# Patient Record
Sex: Female | Born: 1990 | Race: White | Hispanic: No | Marital: Single
Health system: Southern US, Community
[De-identification: ages and names within clinical notes are randomized; demographics above are authoritative.]

---
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
## 2009-03-23 ENCOUNTER — Emergency Department (HOSPITAL_COMMUNITY): Admission: EM | Admit: 2009-03-23 | Discharge: 2009-03-23 | Payer: Self-pay | Admitting: Emergency Medicine

## 2010-10-26 IMAGING — CT CT HEAD W/O CM
1 of 2 series · 16 of 30 positions shown, 20 images · non-contrast
Comparison: None

CLINICAL DATA: Headache and photophobia

CT HEAD WITHOUT CONTRAST
TECHNIQUE: Contiguous axial images were obtained from the base of
the skull through the vertex without intravenous contrast.

[Series 4: head_seq 3.0 h37s st · axial · 0.43mm/px · z∈[-150,-24]mm · 16 of 48 slices shown, 20 images]
[im 3/48  brain]
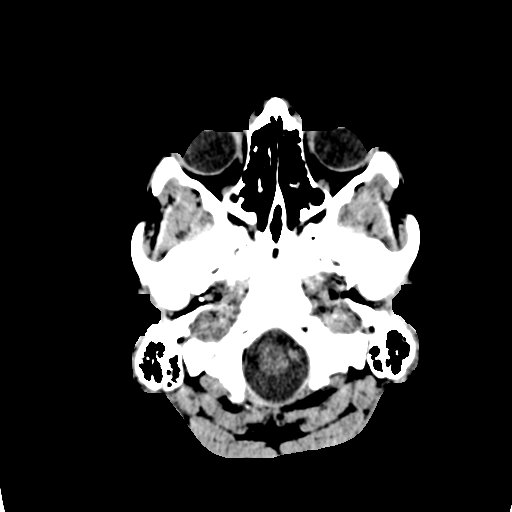
[im 3/48  bone]
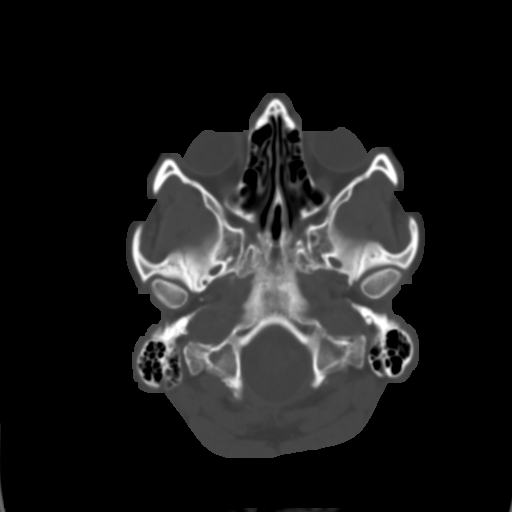
[im 6/48  brain]
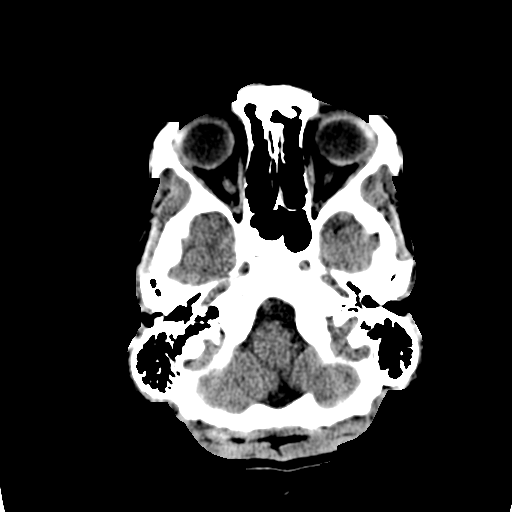
[im 9/48  brain]
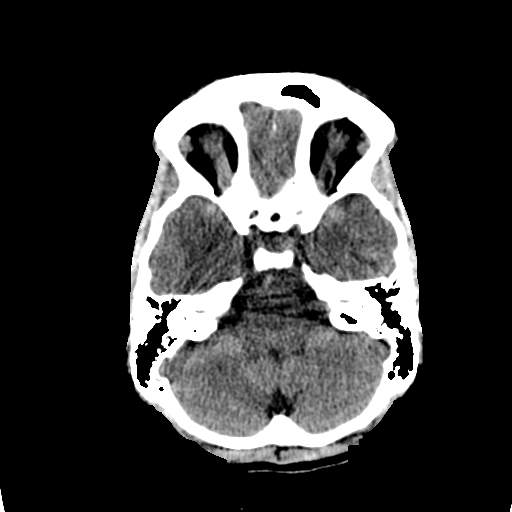
[im 12/48  brain]
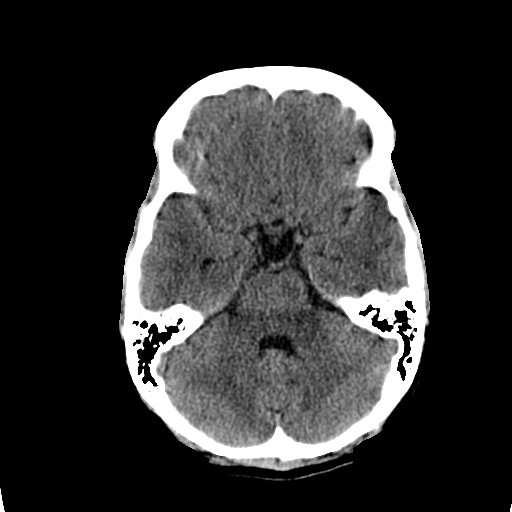
[im 14/48  brain]
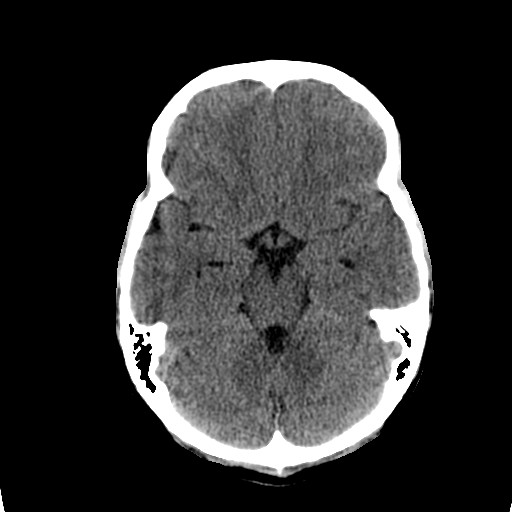
[im 14/48  bone]
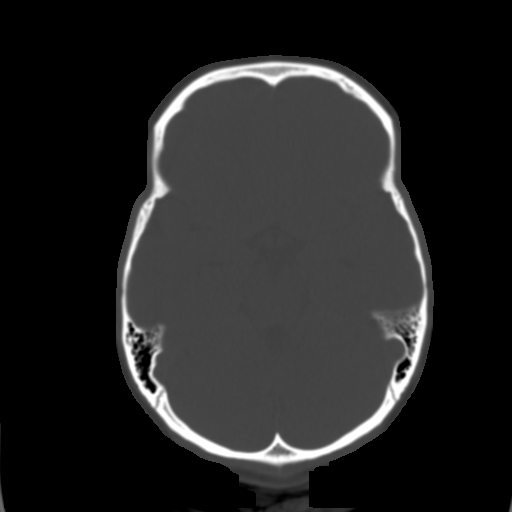
[im 17/48  brain]
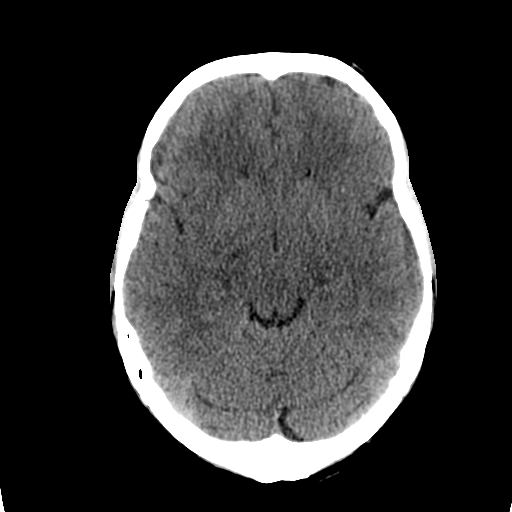
[im 20/48  brain]
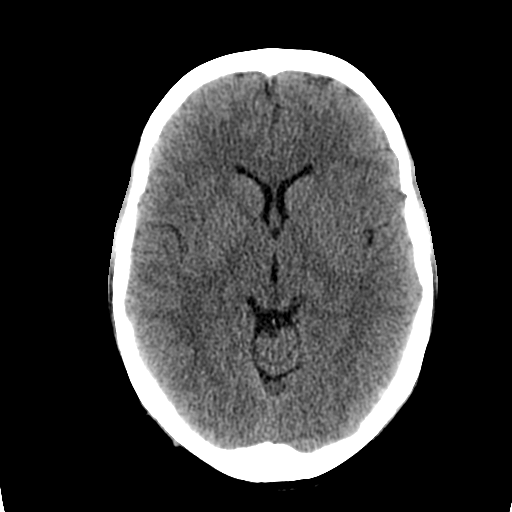
[im 23/48  brain]
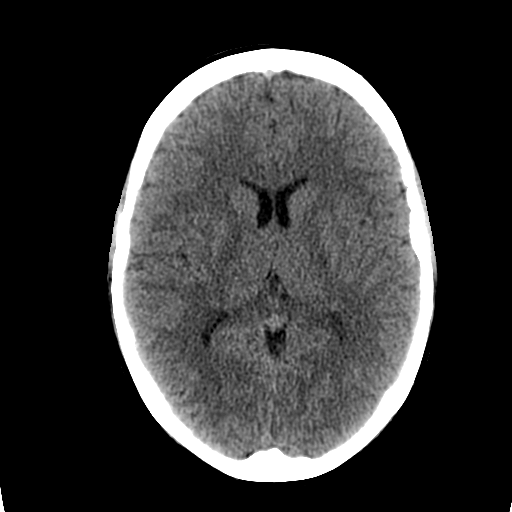
[im 25/48  brain]
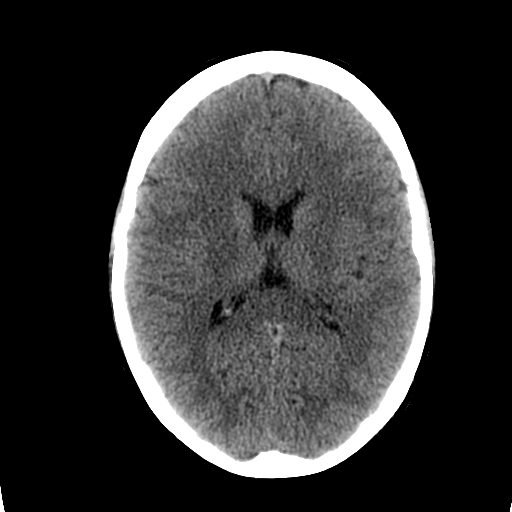
[im 25/48  bone]
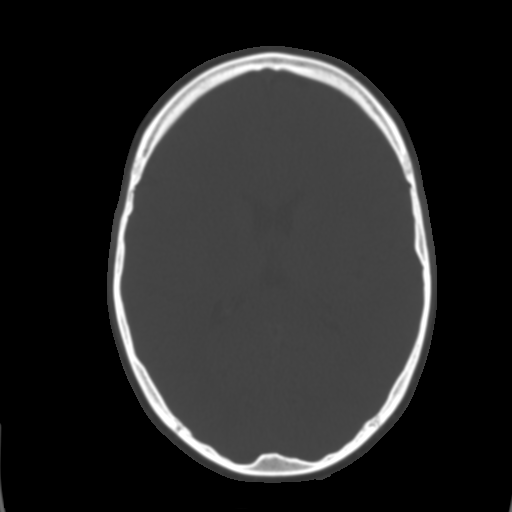
[im 28/48  brain]
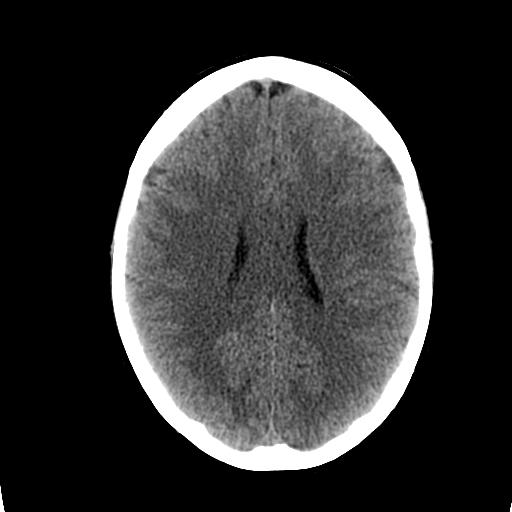
[im 31/48  brain]
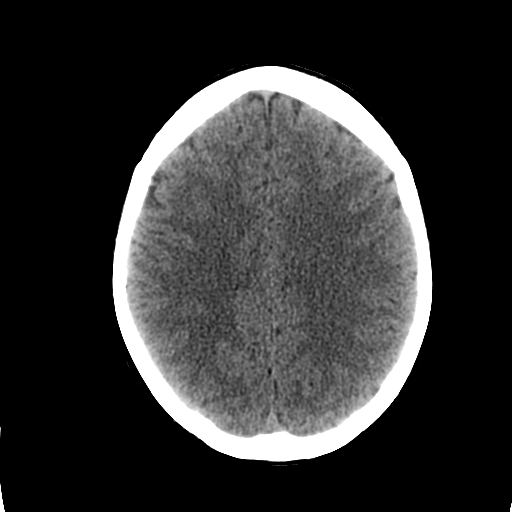
[im 34/48  brain]
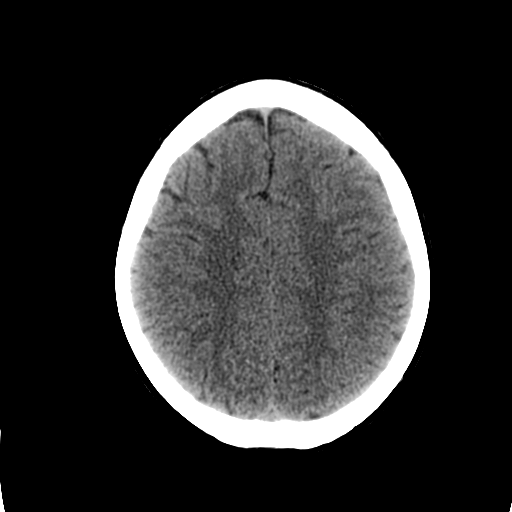
[im 36/48  brain]
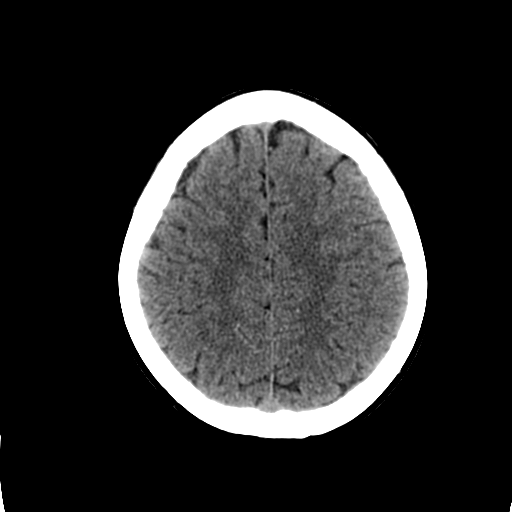
[im 36/48  bone]
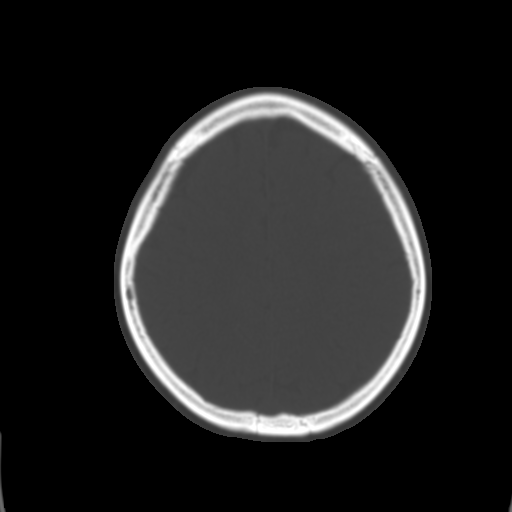
[im 39/48  brain]
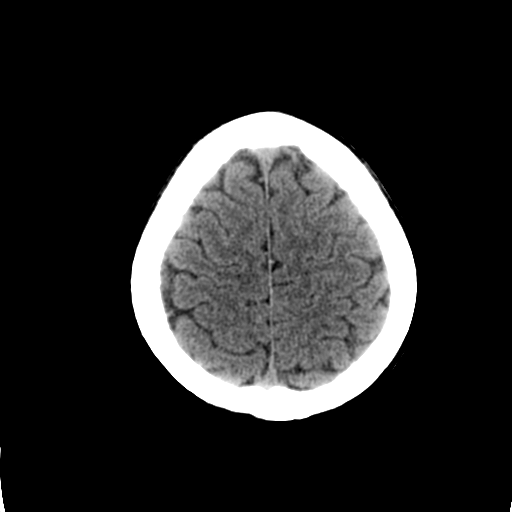
[im 42/48  brain]
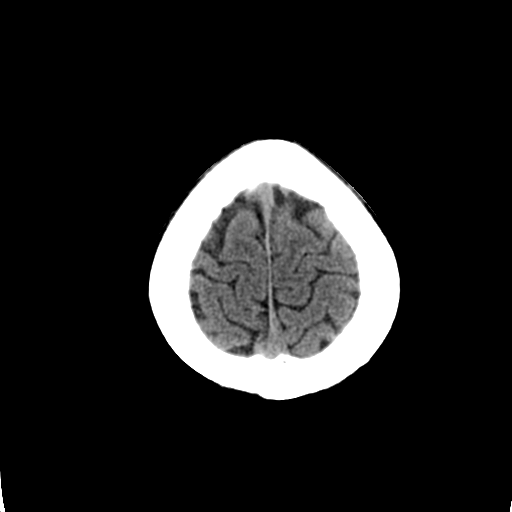
[im 45/48  brain]
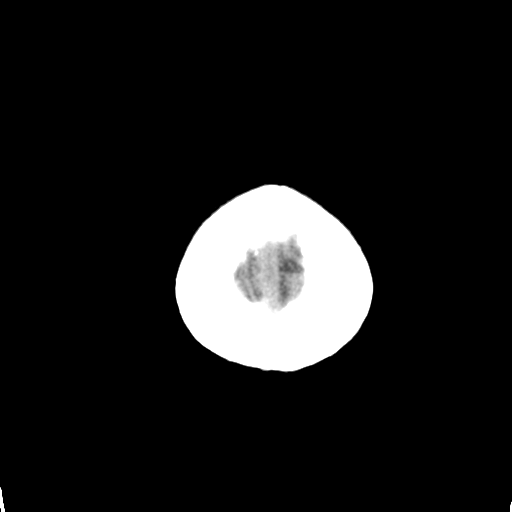

[16 of 30 positions shown; findings below may reference images not displayed]

FINDINGS: Ventricles are normal in size.  There is no intracranial
hemorrhage.  1 cm low density in the left pons is likely an  area
of artifact.  If the patient has right-sided weakness, consider
infarct or mass lesion.  The cerebral white matter is normal.  No
other abnormalities are identified.  The skull is normal.
IMPRESSION: Low density in the left pons.  I would favor this is artifact
however if there are symptoms referable to this area, MRI is
suggested for further evaluation.

## 2011-01-30 LAB — GLUCOSE, CAPILLARY

## 2011-03-10 NOTE — Op Note (Signed)
Tammy Matthews, Tammy Matthews NO.:  [id number]   MEDICAL RECORD NO.:  [id number]          PATIENT TYPE:  AMB   LOCATION:  DSC                          FACILITY:  [facility]   PHYSICIAN:  Loreta Ave, M.D. DATE OF BIRTH:  [birth year]   DATE OF PROCEDURE:  03/15/2006  DATE OF DISCHARGE:                                 OPERATIVE REPORT   PREOPERATIVE DIAGNOSIS:  Left knee anterior cruciate ligament tear with  anterolateral rotary instability.   POSTOPERATIVE DIAGNOSIS:  Left knee anterior cruciate ligament tear with  anterolateral rotary instability.   OPERATIVE PROCEDURE:  Left knee examination under anesthesia, arthroscopy,  with arthroscopic, endoscopic, anterior cruciate ligament reconstruction,  patellar tendon autograft, bone-tendon-bone.  Bioabsorbable screw fixation.  Notchplasty.   SURGEON:  Loreta Ave, M.D.   ASSISTANT:  Julien Girt, P.A.   ANESTHESIA:  General.   BLOOD LOSS:  Minimal.   TOURNIQUET TIME:  One hour and 15 minutes.   SPECIMENS:  None.   CULTURES:  None.   COMPLICATIONS:  None.   DRESSING:  Soft compressed with knee immobilizer.   PROCEDURE:  Patient brought to the operating room.  After adequate  anesthesia had been obtained, the left knee was examined.  Full motion.  Positive Lachman, positive drawer, positive pivot shift.  The patellofemoral  joint and remaining ligament is stable.  Tourniquet applied, prepped and  draped in the usual sterile fashion.  Exsanguinated with elevation and  esmarch.  The tourniquet inflated to 300 mmHg.  Three portals created, one  superolateral, medial, and lateral parapatellar.  Inflow catheter  introduced.  A standard arthroscope introduced.  The knee inspected.  Articular cartilage intact throughout.  Good patellofemoral tracking.  Medial and lateral meniscus intact.  Complete __________ ACL tear, debrided.  Very narrow notch.  Opened up with notchplasty with shaver and high speed  bur.  PCL intact.  Instruments and fluid removed.  Anterior incision,  patella to tibial tubercle.  Middle third patellar tendon harvested 9 mm  wide with bone paste at either end .  Appropriate for her size.  The graft  was then prepared for passage through 9 mm tunnels with FiberWire placed at  either end.  Defect in the tendon closed with Vicryl.  Arthroscope  introduced.  Tibial tunnel created with a small incision next to the  tubercle.  Guidewire placed and then overdrilled with a 9 mm reamer.  Repaired with a shaver.  Femoral guide inserted.  On the back cortex of the  femur, guidewire placed and overdrilled with a 9 mm reamer for appropriate  depth for the graft and pegs.  Both tunnels assessed, found to be in  excellent position.  Utilizing nitinol wires at either end, I used a two pin  passer to pull a graft in across the knee.  With appropriate tensioning  technique and nitinol wires on either side, the graft was firmly fixed with  bioabsorbable screws, 8 x 25 mm, on both sides with the knee tensioned at 70  degrees of flexion.  At completion, excellent capture and fixation of the  graft above and below.  Excellent clearance when viewed arthroscopically  through full motion.  All nitinol wires and sutures have been removed.  Negative Lachman, negative drawer.  Excellent stability.  Wounds were all  irrigated and closed with subcutaneous, subcuticular Vicryl.  Portals closed  with nylon.  Sterile compressive dressing applied.  Tourniquet deflated and  removed.  The knee immobilizer applied.  Anesthesia reversed.  Brought to  the recovery room.  Tolerated the surgery well.  No complications.      Loreta Ave, M.D.  Electronically Signed     DFM/MEDQ  D:  03/15/2006  T:  03/15/2006  Job:  [id number]

## 2022-07-07 ENCOUNTER — Ambulatory Visit (HOSPITAL_COMMUNITY)
Admission: EM | Admit: 2022-07-07 | Discharge: 2022-07-10 | Disposition: A | Discharge status: Home / Self Care | Payer: Commercial Managed Care - PPO | Attending: Family | Admitting: Family

## 2022-07-07 DIAGNOSIS — F22 Delusional disorders: Secondary | ICD-10-CM

## 2022-07-07 DIAGNOSIS — Z20822 Contact with and (suspected) exposure to covid-19: Secondary | ICD-10-CM | POA: Diagnosis not present

## 2022-07-07 DIAGNOSIS — Z79899 Other long term (current) drug therapy: Secondary | ICD-10-CM | POA: Diagnosis not present

## 2022-07-07 DIAGNOSIS — F419 Anxiety disorder, unspecified: Secondary | ICD-10-CM | POA: Insufficient documentation

## 2022-07-07 DIAGNOSIS — F329 Major depressive disorder, single episode, unspecified: Secondary | ICD-10-CM | POA: Insufficient documentation

## 2022-07-07 DIAGNOSIS — F600 Paranoid personality disorder: Secondary | ICD-10-CM | POA: Insufficient documentation

## 2022-07-07 LAB — RESP PANEL BY RT-PCR (FLU A&B, COVID) ARPGX2
Influenza A by PCR: NEGATIVE
Influenza B by PCR: NEGATIVE
SARS Coronavirus 2 by RT PCR: NEGATIVE

## 2022-07-07 LAB — POC SARS CORONAVIRUS 2 AG: SARSCOV2ONAVIRUS 2 AG: NEGATIVE

## 2022-07-07 MED ORDER — MAGNESIUM HYDROXIDE 400 MG/5ML PO SUSP: 30.0000 mL | Freq: Every day | ORAL | Status: DC | PRN

## 2022-07-07 MED ORDER — ACETAMINOPHEN 325 MG PO TABS: 650.0000 mg | ORAL_TABLET | Freq: Four times a day (QID) | ORAL | Status: DC | PRN

## 2022-07-07 MED ORDER — OLANZAPINE 5 MG PO TBDP: 2.5000 mg | ORAL_TABLET | Freq: Two times a day (BID) | ORAL | Status: DC | PRN

## 2022-07-07 MED ORDER — LORAZEPAM 1 MG PO TABS: 1.0000 mg | ORAL_TABLET | ORAL | Status: DC | PRN

## 2022-07-07 MED ORDER — OLANZAPINE 5 MG PO TBDP: 5.0000 mg | ORAL_TABLET | Freq: Three times a day (TID) | ORAL | Status: DC | PRN

## 2022-07-07 MED ORDER — HYDROXYZINE HCL 25 MG PO TABS
25.0000 mg | ORAL_TABLET | Freq: Three times a day (TID) | ORAL | Status: DC | PRN
  Administered 2022-07-08 – 2022-07-09 (×4): 25 mg via ORAL
  Filled 2022-07-07 (×4): qty 1

## 2022-07-07 MED ORDER — TRAZODONE HCL 50 MG PO TABS
50.0000 mg | ORAL_TABLET | Freq: Every evening | ORAL | Status: DC | PRN
  Administered 2022-07-08 – 2022-07-09 (×2): 50 mg via ORAL
  Filled 2022-07-07 (×2): qty 1

## 2022-07-07 MED ORDER — ZIPRASIDONE MESYLATE 20 MG IM SOLR: 20.0000 mg | INTRAMUSCULAR | Status: DC | PRN

## 2022-07-07 MED ORDER — OLANZAPINE 5 MG PO TBDP
5.0000 mg | ORAL_TABLET | Freq: Every day | ORAL | Status: DC
  Administered 2022-07-07 – 2022-07-09 (×3): 5 mg via ORAL
  Filled 2022-07-07 (×3): qty 1

## 2022-07-07 MED ORDER — ALUM & MAG HYDROXIDE-SIMETH 200-200-20 MG/5ML PO SUSP: 30.0000 mL | ORAL | Status: DC | PRN

## 2022-07-07 NOTE — ED Notes (Signed)
Pt continues to rest quietly.  Breathing even and unlabored.  Staff will continue to monitor for safety.
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
## 2022-07-07 NOTE — ED Notes (Signed)
Pt sleeping at this time.  Breathing even and unlabored.

## 2022-07-07 NOTE — ED Triage Notes (Signed)
Pt presents to [facility] accompanied by her parents seeking a mental health evaluation. Pt states she randomly started having issues walking in the past 48 hours which resulted in her falling a few time and she also had difficulty speaking, (slow speech and stuttering).Pts states she was seen in the ED lastnight for her symptoms and was referred to a neurologist.Pts mother states she brought her to this facility for a referral to a psychologist. Pt endorses using cocaine on occasion but nothing in the past 24 hrs.Pt denies SI/HI and AVH

## 2022-07-07 NOTE — ED Provider Notes (Signed)
Patient's mother, Monserath Neff, cell phone number [phone].
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
## 2022-07-08 DIAGNOSIS — F22 Delusional disorders: Secondary | ICD-10-CM | POA: Diagnosis not present

## 2022-07-08 LAB — POCT URINE DRUG SCREEN - MANUAL ENTRY (I-SCREEN)
POC Amphetamine UR: NOT DETECTED
POC Buprenorphine (BUP): NOT DETECTED
POC Cocaine UR: POSITIVE — AB
POC Marijuana UR: POSITIVE — AB
POC Methadone UR: NOT DETECTED
POC Methamphetamine UR: NOT DETECTED
POC Morphine: NOT DETECTED
POC Oxazepam (BZO): NOT DETECTED
POC Oxycodone UR: NOT DETECTED
POC Secobarbital (BAR): NOT DETECTED

## 2022-07-08 LAB — POCT PREGNANCY, URINE: Preg Test, Ur: NEGATIVE

## 2022-07-08 NOTE — Progress Notes (Signed)
Patient is awake on unit without distress.  Patient has been encouraged to drink water as she has been dehydrated and RN has not been able to obtain labs.  Patient is more organized, less manic with softer slower speech and thought.  Her insight into substance use remains poor despite attempts to educate regarding use.  Patient states she has been "sober" from etoh for over 200 days however admits to using cocaine 2 weeks ago.  She does not feel that her cocaine use is a problem.  RN will attempt to obtain labs again shortly.  Will monitor and provide safe environment for patient.  At this time she denies avh shi or plan.  She is pleasant and cooperative with care despite not being happy with admission.

## 2022-07-08 NOTE — Progress Notes (Signed)
Per Beatriz Stallion, NP, patient meets criteria for inpatient treatment. There are no available beds at [facility] today. CSW faxed referrals to the following facilities for review:  [address] [place] [address] [phone] --  [place] [facility] [address] [place] [address] [phone] --  [address] [place] [address] [phone] --  [place] [address], [place] [address] [phone] --  [place] [facility] [address] [place], [place] [address] [phone] --  [place] [address] [place] [address] [phone] --  [place] [address] [place] [address] [phone] --  [place] [address] [place] [address] [phone] --  [place] [address] [place] [address] [phone] --  [place] [address] [place] [address] [phone] --  [place] [address] [place] [address] [phone] --  [place] [address] [place] [address] [phone] --   [place], MSW, [place], LCAS Phone: [phone] Disposition/TOC

## 2022-07-08 NOTE — ED Notes (Signed)
Patient has not ate breakfast/lunch/dinner. Patient stated she is waiting to eat chic fil a as soon as she gets out. Labs not taken yesterday as patient has been irritable but encouraging patient to drink fluids before attempting lab work. Patient observed drinking at least 2 cups of water and is now consenting to lab work and EKG.

## 2022-07-08 NOTE — Progress Notes (Signed)
Patient is asleep in bed without issue.  She asked for and was given PO PRN of trazodone.  Will monitor.

## 2022-07-08 NOTE — ED Provider Notes (Cosign Needed Addendum)
Behavioral Health Progress Note  Date and Time: 07/08/2022 6:51 PM Name: Tammy Matthews MRN:  [id number]  Subjective:  Tammy Matthews 31 y.o., female patient who presented to the [facility] on 11/16/2021.  She was admitted to the continuous assessment unit while awaiting inpatient psychiatric bed availability.  While at the facility patient was placed under involuntary commitment.  IVC petitioner is Doran Heater, NP behavioral health nurse practitioner.  IVC findings, "patient presents with paranoid ideations.  Conversation bizarre and tangential.  She reports conflicts last night because she believed that 3 people had broken in her apartment and shipped her into a closet.  Police arrived to find no evidence that break and occurred.  Patient also endorses bilateral lower extremity weakness, reports falling down stairs at home twice this week.  She is not sleeping.  Patient represents is a danger to herself and appears to lack insight".  Tammy Matthews, 31 y.o., female patient seen face to face by this provider, consulted with Dr. Lucianne Muss; and chart reviewed on 07/08/22.  Per chart review she has been diagnosed with anxiety, MDD, and ADD.  She has services in place without virtual provider Dr. Marella Chimes.  She is prescribed methylphenidate, Lexapro, and doxepin.  She reports compliance with medications.  She denies any history of inpatient psychiatric admissions.  During evaluation Tammy Matthews is observed talking on the phone to her mother.  Her voice is loud and pressured.  She is irritated, slams the phone down and returns to her bed.  She is tearful and states, "my mother is the reason that I am in this place, this jail".  She is alert/oriented x4, cooperative and makes fair eye contact.  She is tangential.  When asked if she is experiencing any depressive symptoms she states, "this whole place makes me depressed, my first amendment rights have been taken away".  Reports that she did get some sleep last  night.  She endorses auditory hallucinations of hearing music specifically to songs.  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  She denies visual hallucinations.  She denies being paranoid but then discusses that her house was broken into the police came to investigate.  Per chart review patient's house was not broken into and the police did not come to her home.  She denies SI/HI.  She denies visual hallucinations.  Patient is guarded throughout the assessment and answers questions minimally.  States, "this is what I get for being honest".  States after she told her mother that she uses cocaine occasionally they brought her here and had her admitted to this place.  Will continue the recommendation for inpatient psychiatric treatment..  Lab work has not been collected this time, patient refused initially.  Nursing staff will retry today.  Diagnosis:  Final diagnoses:  Paranoid ideation (HCC)    Total Time spent with patient: 30 minutes Per chart review she has been diagnosed with anxiety, MDD, and ADD. Past Psychiatric History:  Past Medical History: No past medical history on file. The histories are not reviewed yet. Please review them in the "History" navigator section and refresh this SmartLink. Family History: No family history on file. Family Psychiatric  History: unknown Social History:  Social History   Substance and Sexual Activity  Alcohol Use Not on file     Social History   Substance and Sexual Activity  Drug Use Not on file    Social History   Socioeconomic History   Marital status:  Single    Spouse name: Not on file   Number of children: Not on file   Years of education: Not on file   Highest education level: Not on file  Occupational History   Not on file  Tobacco Use   Smoking status: Not on file   Smokeless tobacco: Not on file  Substance and Sexual Activity   Alcohol use: Not on file   Drug use: Not on file   Sexual activity:  Not on file  Other Topics Concern   Not on file  Social History Narrative   Not on file   Social Determinants of Health   Financial Resource Strain: Not on file  Food Insecurity: Not on file  Transportation Needs: Not on file  Physical Activity: Not on file  Stress: Not on file  Social Connections: Not on file   SDOH:  SDOH Screenings   Depression (PHQ2-9): Low Risk  (07/07/2022)   Additional Social History:    Pain Medications: see MAR Prescriptions: see MAR Over the Counter: see MAR History of alcohol / drug use?: Yes 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 Negative Consequences of Use: Personal relationships Withdrawal Symptoms: None Name of Substance 1: cocaine 1 - Age of First Use: 31 1 - Amount (size/oz): varies 1 - Frequency: 1 x month 1 - Duration: ongoing 1 - Last Use / Amount: about 5 days ago 1 - Method of Aquiring: unknown 1- Route of Use: unknown Name of Substance 2: cannabis 2 - Age of First Use: unknown 2 - Amount (size/oz): varies 2 - Frequency: daily 2 - Duration: ongoing 2 - Last Use / Amount: 2 days ago 2 - Method of Aquiring: unknown 2 - Route of Substance Use: varies                Sleep: Fair  Appetite:  Fair  Current Medications:  Current Facility-Administered Medications  Medication Dose Route Frequency Provider Last Rate Last Admin   acetaminophen (TYLENOL) tablet 650 mg  650 mg Oral Q6H PRN Lenard Lance, FNP       alum & mag hydroxide-simeth (MAALOX/MYLANTA) 200-200-20 MG/5ML suspension 30 mL  30 mL Oral Q4H PRN Lenard Lance, FNP       hydrOXYzine (ATARAX) tablet 25 mg  25 mg Oral TID PRN Lenard Lance, FNP   25 mg at 07/08/22 1208   OLANZapine zydis (ZYPREXA) disintegrating tablet 5 mg  5 mg Oral Q8H PRN Lenard Lance, FNP       And   LORazepam (ATIVAN) tablet 1 mg  1 mg Oral PRN Lenard Lance, FNP       And   ziprasidone (GEODON) injection 20 mg  20 mg Intramuscular PRN Lenard Lance, FNP        magnesium hydroxide (MILK OF MAGNESIA) suspension 30 mL  30 mL Oral Daily PRN Lenard Lance, FNP       OLANZapine zydis (ZYPREXA) disintegrating tablet 2.5 mg  2.5 mg Oral BID PRN Lenard Lance, FNP       OLANZapine zydis (ZYPREXA) disintegrating tablet 5 mg  5 mg Oral QHS Lenard Lance, FNP   5 mg at 07/07/22 2143   traZODone (DESYREL) tablet 50 mg  50 mg Oral QHS PRN Lenard Lance, FNP       Current Outpatient Medications  Medication Sig Dispense Refill   doxepin (SINEQUAN) 50 MG capsule Take 50 mg by mouth at bedtime.  escitalopram (LEXAPRO) 10 MG tablet Take 10 mg by mouth daily.     methylphenidate (RITALIN) 10 MG tablet Take 10 mg by mouth daily.     Multiple Vitamin (MULTIVITAMIN WITH MINERALS) TABS tablet Take 1 tablet by mouth daily.      Labs  Lab Results:  Admission on 07/07/2022  Component Date Value Ref Range Status   SARS Coronavirus 2 by RT PCR 07/07/2022 NEGATIVE  NEGATIVE Final   Comment: (NOTE) SARS-CoV-2 target nucleic acids are NOT DETECTED.  The SARS-CoV-2 RNA is generally detectable in upper respiratory specimens during the acute phase of infection. The lowest concentration of SARS-CoV-2 viral copies this assay can detect is 138 copies/mL. A negative result does not preclude SARS-Cov-2 infection and should not be used as the sole basis for treatment or other patient management decisions. A negative result may occur with  improper specimen collection/handling, submission of specimen other than nasopharyngeal swab, presence of viral mutation(s) within the areas targeted by this assay, and inadequate number of viral copies(<138 copies/mL). A negative result must be combined with clinical observations, patient history, and epidemiological information. The expected result is Negative.  Fact Sheet for Patients:  BloggerCourse.com  Fact Sheet for Healthcare Providers:  SeriousBroker.it  This test is no                           t yet approved or cleared by the [place] FDA and  has been authorized for detection and/or diagnosis of SARS-CoV-2 by FDA under an Emergency Use Authorization (EUA). This EUA will remain  in effect (meaning this test can be used) for the duration of the COVID-19 declaration under Section 564(b)(1) of the Act, 21 U.S.C.section 360bbb-3(b)(1), unless the authorization is terminated  or revoked sooner.       Influenza A by PCR 07/07/2022 NEGATIVE  NEGATIVE Final   Influenza B by PCR 07/07/2022 NEGATIVE  NEGATIVE Final   Comment: (NOTE) The Xpert Xpress SARS-CoV-2/FLU/RSV plus assay is intended as an aid in the diagnosis of influenza from Nasopharyngeal swab specimens and should not be used as a sole basis for treatment. Nasal washings and aspirates are unacceptable for Xpert Xpress SARS-CoV-2/FLU/RSV testing.  Fact Sheet for Patients: BloggerCourse.com  Fact Sheet for Healthcare Providers: SeriousBroker.it  This test is not yet approved or cleared by the [place] FDA and has been authorized for detection and/or diagnosis of SARS-CoV-2 by FDA under an Emergency Use Authorization (EUA). This EUA will remain in effect (meaning this test can be used) for the duration of the COVID-19 declaration under Section 564(b)(1) of the Act, 21 U.S.C. section 360bbb-3(b)(1), unless the authorization is terminated or revoked.  Performed at [facility], [address] [place], [place] [address]    POC Amphetamine UR 07/08/2022 None Detected  NONE DETECTED (Cut Off Level 1000 ng/mL) Final   POC Secobarbital (BAR) 07/08/2022 None Detected  NONE DETECTED (Cut Off Level 300 ng/mL) Final   POC Buprenorphine (BUP) 07/08/2022 None Detected  NONE DETECTED (Cut Off Level 10 ng/mL) Final   POC Oxazepam (BZO) 07/08/2022 None Detected  NONE DETECTED (Cut Off Level 300 ng/mL) Final   POC Cocaine UR 07/08/2022 Positive (A)   NONE DETECTED (Cut Off Level 300 ng/mL) Final   POC Methamphetamine UR 07/08/2022 None Detected  NONE DETECTED (Cut Off Level 1000 ng/mL) Final   POC Morphine 07/08/2022 None Detected  NONE DETECTED (Cut Off Level 300 ng/mL) Final   POC Methadone  UR 07/08/2022 None Detected  NONE DETECTED (Cut Off Level 300 ng/mL) Final   POC Oxycodone UR 07/08/2022 None Detected  NONE DETECTED (Cut Off Level 100 ng/mL) Final   POC Marijuana UR 07/08/2022 Positive (A)  NONE DETECTED (Cut Off Level 50 ng/mL) Final   SARSCOV2ONAVIRUS 2 AG 07/07/2022 NEGATIVE  NEGATIVE Final   Comment: (NOTE) SARS-CoV-2 antigen NOT DETECTED.   Negative results are presumptive.  Negative results do not preclude SARS-CoV-2 infection and should not be used as the sole basis for treatment or other patient management decisions, including infection  control decisions, particularly in the presence of clinical signs and  symptoms consistent with COVID-19, or in those who have been in contact with the virus.  Negative results must be combined with clinical observations, patient history, and epidemiological information. The expected result is Negative.  Fact Sheet for Patients: https://www.jennings-kim.com/https://www.fda.gov/media/141569/download  Fact Sheet for Healthcare Providers: https://alexander-rogers.biz/https://www.fda.gov/media/141568/download  This test is not yet approved or cleared by the [place] FDA and  has been authorized for detection and/or diagnosis of SARS-CoV-2 by FDA under an Emergency Use Authorization (EUA).  This EUA will remain in effect (meaning this test can be used) for the duration of  the COV                          ID-19 declaration under Section 564(b)(1) of the Act, 21 U.S.C. section 360bbb-3(b)(1), unless the authorization is terminated or revoked sooner.     Preg Test, Ur 07/08/2022 NEGATIVE  NEGATIVE Final   Comment:        THE SENSITIVITY OF THIS METHODOLOGY IS >24 mIU/mL     Blood Alcohol level:  No results found for:  "ETH"  Metabolic Disorder Labs: No results found for: "HGBA1C", "MPG" No results found for: "PROLACTIN" No results found for: "CHOL", "TRIG", "HDL", "CHOLHDL", "VLDL", "LDLCALC"  Therapeutic Lab Levels: No results found for: "LITHIUM" No results found for: "VALPROATE" No results found for: "CBMZ"  Physical Findings   PHQ2-9    [facility]  PHQ-2 Total Score 1      [facility]  C-SSRS RISK CATEGORY No Risk        Musculoskeletal  Strength & Muscle Tone: within normal limits Gait & Station: normal Patient leans: N/A  Psychiatric Specialty Exam  Presentation  General Appearance: Casual  Eye Contact:Fair  Speech:Pressured  Speech Volume:Normal  Handedness:Right   Mood and Affect  Mood:Anxious; Labile  Affect:Congruent; Labile; Depressed; Tearful   Thought Process  Thought Processes:Coherent  Descriptions of Associations:Intact  Orientation:Full (Time, Place and Person)  Thought Content:Tangential  Diagnosis of Schizophrenia or Schizoaffective disorder in past: No  Duration of Psychotic Symptoms: Less than six months   Hallucinations:Hallucinations: Auditory Description of Auditory Hallucinations: hears music  Ideas of Reference:Paranoia  Suicidal Thoughts:Suicidal Thoughts: No  Homicidal Thoughts:Homicidal Thoughts: No   Sensorium  Memory:Immediate Fair; Remote Fair; Recent Fair  Judgment:Impaired  Insight:Lacking   Executive Functions  Concentration:Fair  Attention Span:Fair  Recall:Fair  Fund of Knowledge:Fair  Language:Good   Psychomotor Activity  Psychomotor Activity:Psychomotor Activity: Normal   Assets  Assets:Communication Skills; Desire for Improvement; Resilience; Social Support; Physical Health; Leisure Time   Sleep  Sleep:Sleep: Fair   Nutritional Assessment (For OBS and FBC  admissions only) Has the patient had a weight loss or gain of 10 pounds or more in the last 3 months?:  No Has the patient had a decrease in food intake/or appetite?: No Does the patient have dental problems?: No Does the patient have eating habits or behaviors that may be indicators of an eating disorder including binging or inducing vomiting?: No Has the patient recently lost weight without trying?: 0 Has the patient been eating poorly because of a decreased appetite?: 0 Malnutrition Screening Tool Score: 0    Physical Exam  Physical Exam Vitals and nursing note reviewed.  Constitutional:      General: She is not in acute distress.    Appearance: Normal appearance. She is not ill-appearing.  HENT:     Head: Normocephalic.  Eyes:     Conjunctiva/sclera: Conjunctivae normal.  Cardiovascular:     Rate and Rhythm: Normal rate.  Pulmonary:     Effort: Pulmonary effort is normal.  Musculoskeletal:        General: Normal range of motion.     Cervical back: Normal range of motion.  Skin:    Coloration: Skin is not jaundiced or pale.  Neurological:     Mental Status: She is alert and oriented to person, place, and time.  Psychiatric:        Attention and Perception: She perceives auditory hallucinations.        Mood and Affect: Mood is anxious and depressed. Affect is tearful.        Speech: Speech is rapid and pressured and tangential.        Behavior: Behavior is cooperative.        Thought Content: Thought content is paranoid.        Cognition and Memory: Cognition normal.        Judgment: Judgment is impulsive.    Review of Systems  Constitutional: Negative.   HENT: Negative.    Eyes: Negative.   Respiratory: Negative.    Cardiovascular: Negative.   Musculoskeletal: Negative.   Skin: Negative.   Neurological: Negative.   Psychiatric/Behavioral:  Positive for depression, hallucinations and suicidal ideas. The patient is nervous/anxious.    Blood pressure 117/88, pulse  (!) 110, temperature 97.7 F (36.5 C), temperature source Oral, resp. rate 18, SpO2 100 %. There is no height or weight on file to calculate BMI.  Treatment Plan Summary:  Disposition: Patient continues to meet inpatient psychiatric admission.  IVC will remain in place.  Will continue to have daily contact with patient to assess and evaluate symptoms and progress in treatment and Medication management  Revonda Humphrey, NP 07/08/2022 6:51 PM

## 2022-07-08 NOTE — ED Notes (Signed)
Patient sleeping. Breathing even and unlabored. No s/s of current distress.

## 2022-07-08 NOTE — ED Notes (Addendum)
Patient irritable and tearful due to feeling like she is "in jail." Patient is discharged focus and refused breakfast/lunch. Patient denies SI, HI, but endorses auditory hallucinations of "music." Patient upset with her mother and feels like she is being punished for telling the truth. Patient spoke with mother and is now laying back in bed. No aggressive behavior and remains safe on unit.

## 2022-07-08 NOTE — ED Notes (Signed)
Tammy Matthews complained of having increasing anxiety medicated per order atarax

## 2022-07-08 NOTE — Progress Notes (Addendum)
Patient given atarax po prn due to anxiety and per patient request.  1:00pm: Medication effective. Patient calmly resting in bed.

## 2022-07-09 ENCOUNTER — Encounter (HOSPITAL_COMMUNITY): Payer: Self-pay

## 2022-07-09 LAB — CBC WITH DIFFERENTIAL/PLATELET
Abs Immature Granulocytes: 0.01 10*3/uL (ref 0.00–0.07)
Basophils Absolute: 0 10*3/uL (ref 0.0–0.1)
Basophils Relative: 1 %
Eosinophils Absolute: 0.2 10*3/uL (ref 0.0–0.5)
Eosinophils Relative: 4 %
HCT: 40.1 % (ref 36.0–46.0)
Hemoglobin: 13.5 g/dL (ref 12.0–15.0)
Immature Granulocytes: 0 %
Lymphocytes Relative: 47 %
Lymphs Abs: 2.2 10*3/uL (ref 0.7–4.0)
MCH: 29.8 pg (ref 26.0–34.0)
MCHC: 33.7 g/dL (ref 30.0–36.0)
MCV: 88.5 fL (ref 80.0–100.0)
Monocytes Absolute: 0.4 10*3/uL (ref 0.1–1.0)
Monocytes Relative: 8 %
Neutro Abs: 1.9 10*3/uL (ref 1.7–7.7)
Neutrophils Relative %: 40 %
Platelets: 405 10*3/uL — ABNORMAL HIGH (ref 150–400)
RBC: 4.53 MIL/uL (ref 3.87–5.11)
RDW: 12.8 % (ref 11.5–15.5)
WBC: 4.6 10*3/uL (ref 4.0–10.5)
nRBC: 0 % (ref 0.0–0.2)

## 2022-07-09 LAB — COMPREHENSIVE METABOLIC PANEL
ALT: 19 U/L (ref 0–44)
AST: 21 U/L (ref 15–41)
Albumin: 3.9 g/dL (ref 3.5–5.0)
Alkaline Phosphatase: 69 U/L (ref 38–126)
Anion gap: 12 (ref 5–15)
BUN: 5 mg/dL — ABNORMAL LOW (ref 6–20)
CO2: 25 mmol/L (ref 22–32)
Calcium: 9.5 mg/dL (ref 8.9–10.3)
Chloride: 103 mmol/L (ref 98–111)
Creatinine, Ser: 0.98 mg/dL (ref 0.44–1.00)
GFR, Estimated: >60 mL/min (ref >60)
Glucose, Bld: 75 mg/dL (ref 70–99)
Potassium: 3.8 mmol/L (ref 3.5–5.1)
Sodium: 140 mmol/L (ref 135–145)
Total Bilirubin: 0.5 mg/dL (ref 0.3–1.2)
Total Protein: 6.9 g/dL (ref 6.5–8.1)

## 2022-07-09 LAB — ETHANOL: Alcohol, Ethyl (B): <10 mg/dL (ref <10)

## 2022-07-09 LAB — TSH: TSH: 2.439 u[IU]/mL (ref 0.350–4.500)

## 2022-07-09 LAB — HEMOGLOBIN A1C
Hgb A1c MFr Bld: 5.3 % (ref 4.8–5.6)
Mean Plasma Glucose: 105.41 mg/dL

## 2022-07-09 LAB — MAGNESIUM: Magnesium: 2.2 mg/dL (ref 1.7–2.4)

## 2022-07-09 MED ORDER — ESCITALOPRAM OXALATE 10 MG PO TABS
10.0000 mg | ORAL_TABLET | Freq: Every day | ORAL | Status: DC
  Administered 2022-07-09 – 2022-07-10 (×2): 10 mg via ORAL
  Filled 2022-07-09 (×2): qty 1

## 2022-07-09 NOTE — ED Notes (Signed)
Patient continues to rest with no sxs of distress noted - will continue to monitor for safety 

## 2022-07-09 NOTE — ED Notes (Signed)
Patient received atarax po prn due to anxiety.

## 2022-07-09 NOTE — ED Provider Notes (Signed)
Behavioral Health Progress Note  Date and Time: 07/09/2022 2:46 PM Name: Tammy Matthews MRN:  [id number]  Subjective:    Subjective:  Tammy Matthews 31 y.o., female patient who presented to the [facility] on 11/16/2021.  She was admitted to the continuous assessment unit while awaiting inpatient psychiatric bed availability.  While at the facility patient was placed under involuntary commitment.   IVC petitioner is Beatriz Stallion, NP behavioral health nurse practitioner.  IVC findings, "patient presents with paranoid ideations.  Conversation bizarre and tangential.  She reports conflicts last night because she believed that 3 people had broken in her apartment and shipped her into a closet.  Police arrived to find no evidence that break and occurred.  Patient also endorses bilateral lower extremity weakness, reports falling down stairs at home twice this week.  She is not sleeping.  Patient represents is a danger to herself and appears to lack insight".   Tammy Matthews, 31 y.o., female patient seen face to face by this provider, consulted with Dr. Dwyane Dee; and chart reviewed on 07/09/22.  Per chart review she has been diagnosed with anxiety, MDD, and ADD.  She has services in place without virtual provider Dr. Lowella Grip.  She is prescribed methylphenidate, Lexapro, and doxepin.  She reports compliance with medications.  She denies any history of inpatient psychiatric admissions.  Upon admission UDS is positive for cocaine and marijuana.  EtOH is negative.  On today's evaluation Tammy Matthews is observed sitting in her bed in no acute distress.  She is calm, pleasant and bright upon approach.  She has normal speech and behavior.  Reports she was able to sleep all night and feels "much better today".  She attributes her behavior over the past few days to the cocaine that she used last Thursday.  States the cocaine tasted funny and it could have been laced with other substances.  States after she used the  cocaine she did not sleep for a day or 2.  She is determined to no longer use cocaine or marijuana.  She reports her mood as euthymic with a congruent affect.  She is denying depression at this time.  She continues to endorse auditory hallucinations of music.  States this is not distressful to her, she likes to hear music and she does not want to turn that off.  She denies HI/VH.  She is denying racing thoughts and paranoia.  Objectively she does not appear to be responding to internal/external stimuli.  Overall patient appears to be improving.  Collateral Tammy Matthews patient's mother.  Patient's mother agrees that patient is improving, she talked to her earlier on the phone.  Patient's mother is in agreement that patient could possibly be discharged in the a.m.  She would like for patient to come and stay with her in [place] for 1-2 weeks.  Discussed with patient and mother CD IOP program.  Patient is interested.  Referral was made.   Diagnosis:  Final diagnoses:  Paranoid ideation ([place])    Total Time spent with patient: 30 minutes  Past Psychiatric History: See H&P Past Medical History: No past medical history on file. The histories are not reviewed yet. Please review them in the "History" navigator section and refresh this [place]. Family History: No family history on file. Family Psychiatric  History: See H&P Social History:  Social History   Substance and Sexual Activity  Alcohol Use Not on file     Social History   Substance and Sexual Activity  Drug  Use Not on file    Social History   Socioeconomic History   Marital status: Single    Spouse name: Not on file   Number of children: Not on file   Years of education: Not on file   Highest education level: Not on file  Occupational History   Not on file  Tobacco Use   Smoking status: Not on file   Smokeless tobacco: Not on file  Substance and Sexual Activity   Alcohol use: Not on file   Drug use: Not on file    Sexual activity: Not on file  Other Topics Concern   Not on file  Social History Narrative   Not on file   Social Determinants of Health   Financial Resource Strain: Not on file  Food Insecurity: Not on file  Transportation Needs: Not on file  Physical Activity: Not on file  Stress: Not on file  Social Connections: Not on file   SDOH:  SDOH Screenings   Depression (PHQ2-9): Low Risk  (07/07/2022)   Additional Social History:    Pain Medications: see MAR Prescriptions: see MAR Over the Counter: see MAR History of alcohol / drug use?: Yes 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 Negative Consequences of Use: Personal relationships Withdrawal Symptoms: None Name of Substance 1: cocaine 1 - Age of First Use: 31 1 - Amount (size/oz): varies 1 - Frequency: 1 x month 1 - Duration: ongoing 1 - Last Use / Amount: about 5 days ago 1 - Method of Aquiring: unknown 1- Route of Use: unknown Name of Substance 2: cannabis 2 - Age of First Use: unknown 2 - Amount (size/oz): varies 2 - Frequency: daily 2 - Duration: ongoing 2 - Last Use / Amount: 2 days ago 2 - Method of Aquiring: unknown 2 - Route of Substance Use: varies                Sleep: Good  Appetite:  Good  Current Medications:  Current Facility-Administered Medications  Medication Dose Route Frequency Provider Last Rate Last Admin   acetaminophen (TYLENOL) tablet 650 mg  650 mg Oral Q6H PRN Lenard LanceAllen, Tina L, FNP       alum & mag hydroxide-simeth (MAALOX/MYLANTA) 200-200-20 MG/5ML suspension 30 mL  30 mL Oral Q4H PRN Lenard LanceAllen, Tina L, FNP       escitalopram (LEXAPRO) tablet 10 mg  10 mg Oral Daily Ardis Hughsoleman, Keyonni Percival H, NP       hydrOXYzine (ATARAX) tablet 25 mg  25 mg Oral TID PRN Lenard LanceAllen, Tina L, FNP   25 mg at 07/09/22 0651   OLANZapine zydis (ZYPREXA) disintegrating tablet 5 mg  5 mg Oral Q8H PRN Lenard LanceAllen, Tina L, FNP       And   LORazepam (ATIVAN) tablet 1 mg  1 mg Oral PRN Lenard LanceAllen, Tina L,  FNP       And   ziprasidone (GEODON) injection 20 mg  20 mg Intramuscular PRN Lenard LanceAllen, Tina L, FNP       magnesium hydroxide (MILK OF MAGNESIA) suspension 30 mL  30 mL Oral Daily PRN Lenard LanceAllen, Tina L, FNP       OLANZapine zydis (ZYPREXA) disintegrating tablet 2.5 mg  2.5 mg Oral BID PRN Lenard LanceAllen, Tina L, FNP       OLANZapine zydis (ZYPREXA) disintegrating tablet 5 mg  5 mg Oral QHS Lenard LanceAllen, Tina L, FNP   5 mg at 07/08/22 2216   traZODone (DESYREL) tablet 50 mg  50 mg  Oral QHS PRN Lenard Lance, FNP   50 mg at 07/08/22 2216   Current Outpatient Medications  Medication Sig Dispense Refill   doxepin (SINEQUAN) 50 MG capsule Take 50 mg by mouth at bedtime.     escitalopram (LEXAPRO) 10 MG tablet Take 10 mg by mouth daily.     methylphenidate (RITALIN) 10 MG tablet Take 10 mg by mouth daily.     Multiple Vitamin (MULTIVITAMIN WITH MINERALS) TABS tablet Take 1 tablet by mouth daily.      Labs  Lab Results:  Admission on 07/07/2022  Component Date Value Ref Range Status   SARS Coronavirus 2 by RT PCR 07/07/2022 NEGATIVE  NEGATIVE Final   Comment: (NOTE) SARS-CoV-2 target nucleic acids are NOT DETECTED.  The SARS-CoV-2 RNA is generally detectable in upper respiratory specimens during the acute phase of infection. The lowest concentration of SARS-CoV-2 viral copies this assay can detect is 138 copies/mL. A negative result does not preclude SARS-Cov-2 infection and should not be used as the sole basis for treatment or other patient management decisions. A negative result may occur with  improper specimen collection/handling, submission of specimen other than nasopharyngeal swab, presence of viral mutation(s) within the areas targeted by this assay, and inadequate number of viral copies(<138 copies/mL). A negative result must be combined with clinical observations, patient history, and epidemiological information. The expected result is Negative.  Fact Sheet for Patients:   BloggerCourse.com  Fact Sheet for Healthcare Providers:  SeriousBroker.it  This test is no                          t yet approved or cleared by the [place] FDA and  has been authorized for detection and/or diagnosis of SARS-CoV-2 by FDA under an Emergency Use Authorization (EUA). This EUA will remain  in effect (meaning this test can be used) for the duration of the COVID-19 declaration under Section 564(b)(1) of the Act, 21 U.S.C.section 360bbb-3(b)(1), unless the authorization is terminated  or revoked sooner.       Influenza A by PCR 07/07/2022 NEGATIVE  NEGATIVE Final   Influenza B by PCR 07/07/2022 NEGATIVE  NEGATIVE Final   Comment: (NOTE) The Xpert Xpress SARS-CoV-2/FLU/RSV plus assay is intended as an aid in the diagnosis of influenza from Nasopharyngeal swab specimens and should not be used as a sole basis for treatment. Nasal washings and aspirates are unacceptable for Xpert Xpress SARS-CoV-2/FLU/RSV testing.  Fact Sheet for Patients: BloggerCourse.com  Fact Sheet for Healthcare Providers: SeriousBroker.it  This test is not yet approved or cleared by the [place] FDA and has been authorized for detection and/or diagnosis of SARS-CoV-2 by FDA under an Emergency Use Authorization (EUA). This EUA will remain in effect (meaning this test can be used) for the duration of the COVID-19 declaration under Section 564(b)(1) of the Act, 21 U.S.C. section 360bbb-3(b)(1), unless the authorization is terminated or revoked.  Performed at [facility], [address] [place], [place] [address]    WBC 07/09/2022 4.6  4.0 - 10.5 K/uL Final   RBC 07/09/2022 4.53  3.87 - 5.11 MIL/uL Final   Hemoglobin 07/09/2022 13.5  12.0 - 15.0 g/dL Final   HCT 07/62/2633 40.1  36.0 - 46.0 % Final   MCV 07/09/2022 88.5  80.0 - 100.0 fL Final   MCH 07/09/2022 29.8  26.0 - 34.0  pg Final   MCHC 07/09/2022 33.7  30.0 - 36.0 g/dL Final   RDW  07/09/2022 12.8  11.5 - 15.5 % Final   Platelets 07/09/2022 405 (H)  150 - 400 K/uL Final   nRBC 07/09/2022 0.0  0.0 - 0.2 % Final   Neutrophils Relative % 07/09/2022 40  % Final   Neutro Abs 07/09/2022 1.9  1.7 - 7.7 K/uL Final   Lymphocytes Relative 07/09/2022 47  % Final   Lymphs Abs 07/09/2022 2.2  0.7 - 4.0 K/uL Final   Monocytes Relative 07/09/2022 8  % Final   Monocytes Absolute 07/09/2022 0.4  0.1 - 1.0 K/uL Final   Eosinophils Relative 07/09/2022 4  % Final   Eosinophils Absolute 07/09/2022 0.2  0.0 - 0.5 K/uL Final   Basophils Relative 07/09/2022 1  % Final   Basophils Absolute 07/09/2022 0.0  0.0 - 0.1 K/uL Final   Immature Granulocytes 07/09/2022 0  % Final   Abs Immature Granulocytes 07/09/2022 0.01  0.00 - 0.07 K/uL Final   Performed at [facility], [address], [place], [place] [address]   Sodium 07/09/2022 140  135 - 145 mmol/L Final   Potassium 07/09/2022 3.8  3.5 - 5.1 mmol/L Final   Chloride 07/09/2022 103  98 - 111 mmol/L Final   CO2 07/09/2022 25  22 - 32 mmol/L Final   Glucose, Bld 07/09/2022 75  70 - 99 mg/dL Final   Glucose reference range applies only to samples taken after fasting for at least 8 hours.   BUN 07/09/2022 5 (L)  6 - 20 mg/dL Final   Creatinine, Ser 07/09/2022 0.98  0.44 - 1.00 mg/dL Final   Calcium 36/14/4315 9.5  8.9 - 10.3 mg/dL Final   Total Protein 40/05/6760 6.9  6.5 - 8.1 g/dL Final   Albumin 95/06/3266 3.9  3.5 - 5.0 g/dL Final   AST 12/45/8099 21  15 - 41 U/L Final   ALT 07/09/2022 19  0 - 44 U/L Final   Alkaline Phosphatase 07/09/2022 69  38 - 126 U/L Final   Total Bilirubin 07/09/2022 0.5  0.3 - 1.2 mg/dL Final   GFR, Estimated 07/09/2022 >60  >60 mL/min Final   Comment: (NOTE) Calculated using the CKD-EPI Creatinine Equation (2021)    Anion gap 07/09/2022 12  5 - 15 Final   Performed at [facility], [address], [place], [place] [address]   Hgb  A1c MFr Bld 07/09/2022 5.3  4.8 - 5.6 % Final   Comment: (NOTE) Pre diabetes:          5.7%-6.4%  Diabetes:              >6.4%  Glycemic control for   <7.0% adults with diabetes    Mean Plasma Glucose 07/09/2022 105.41  mg/dL Final   Performed at [facility], [address] [place], [place] [address]   Magnesium 07/09/2022 2.2  1.7 - 2.4 mg/dL Final   Performed at [facility], [address], [place], [place] [address]   Alcohol, Ethyl (B) 07/09/2022 <10  <10 mg/dL Final   Comment: (NOTE) Lowest detectable limit for serum alcohol is 10 mg/dL.  For medical purposes only. Performed at [facility], [address], [place], [place] [address]    TSH 07/09/2022 2.439  0.350 - 4.500 uIU/mL Final   Comment: Performed by a 3rd Generation assay with a functional sensitivity of <=0.01 uIU/mL. Performed at [facility], [address], [place], [place] [address]    POC Amphetamine UR 07/08/2022 None Detected  NONE DETECTED (Cut Off Level 1000 ng/mL) Final   POC Secobarbital (BAR) 07/08/2022 None Detected  NONE DETECTED (Cut Off Level 300 ng/mL) Final   POC Buprenorphine (BUP) 07/08/2022 None Detected  NONE DETECTED (Cut Off Level 10 ng/mL) Final   POC Oxazepam (BZO) 07/08/2022 None Detected  NONE DETECTED (Cut Off Level 300 ng/mL) Final   POC Cocaine UR 07/08/2022 Positive (A)  NONE DETECTED (Cut Off Level 300 ng/mL) Final   POC Methamphetamine UR 07/08/2022 None Detected  NONE DETECTED (Cut Off Level 1000 ng/mL) Final   POC Morphine 07/08/2022 None Detected  NONE DETECTED (Cut Off Level 300 ng/mL) Final   POC Methadone UR 07/08/2022 None Detected  NONE DETECTED (Cut Off Level 300 ng/mL) Final   POC Oxycodone UR 07/08/2022 None Detected  NONE DETECTED (Cut Off Level 100 ng/mL) Final   POC Marijuana UR 07/08/2022 Positive (A)  NONE DETECTED (Cut Off Level 50 ng/mL) Final   SARSCOV2ONAVIRUS 2 AG 07/07/2022 NEGATIVE  NEGATIVE Final   Comment: (NOTE) SARS-CoV-2  antigen NOT DETECTED.   Negative results are presumptive.  Negative results do not preclude SARS-CoV-2 infection and should not be used as the sole basis for treatment or other patient management decisions, including infection  control decisions, particularly in the presence of clinical signs and  symptoms consistent with COVID-19, or in those who have been in contact with the virus.  Negative results must be combined with clinical observations, patient history, and epidemiological information. The expected result is Negative.  Fact Sheet for Patients: https://www.jennings-kim.com/  Fact Sheet for Healthcare Providers: https://alexander-rogers.biz/  This test is not yet approved or cleared by the [place] FDA and  has been authorized for detection and/or diagnosis of SARS-CoV-2 by FDA under an Emergency Use Authorization (EUA).  This EUA will remain in effect (meaning this test can be used) for the duration of  the COV                          ID-19 declaration under Section 564(b)(1) of the Act, 21 U.S.C. section 360bbb-3(b)(1), unless the authorization is terminated or revoked sooner.     Preg Test, Ur 07/08/2022 NEGATIVE  NEGATIVE Final   Comment:        THE SENSITIVITY OF THIS METHODOLOGY IS >24 mIU/mL     Blood Alcohol level:  Lab Results  Component Value Date   ETH <10 07/09/2022    Metabolic Disorder Labs: Lab Results  Component Value Date   HGBA1C 5.3 07/09/2022   MPG 105.41 07/09/2022   No results found for: "PROLACTIN" No results found for: "CHOL", "TRIG", "HDL", "CHOLHDL", "VLDL", "LDLCALC"  Therapeutic Lab Levels: No results found for: "LITHIUM" No results found for: "VALPROATE" No results found for: "CBMZ"  Physical Findings   PHQ2-9    Flowsheet Row ED from 07/07/2022 in [facility]  PHQ-2 Total Score 1      Flowsheet Row ED from 07/07/2022 in [facility]   C-SSRS RISK CATEGORY No Risk        Musculoskeletal  Strength & Muscle Tone: within normal limits Gait & Station: normal Patient leans: N/A  Psychiatric Specialty Exam  Presentation  General Appearance: Casual  Eye Contact:Good  Speech:Clear and Coherent; Normal Rate  Speech Volume:Normal  Handedness:Right   Mood and Affect  Mood:Euthymic  Affect:Congruent   Thought Process  Thought Processes:Coherent  Descriptions of Associations:Intact  Orientation:Full (Time, Place and Person)  Thought Content:Logical  Diagnosis of Schizophrenia or Schizoaffective disorder in past: No  Duration of Psychotic Symptoms: Less than six months   Hallucinations:Hallucinations: Auditory Description of Auditory Hallucinations: hears music  Ideas of Reference:None  Suicidal Thoughts:Suicidal Thoughts: No  Homicidal Thoughts:Homicidal Thoughts: No   Sensorium  Memory:Immediate Good; Recent Good; Remote Good  Judgment:Fair  Insight:Fair   Executive Functions  Concentration:Good  Attention Span:Good  Recall:Good  Fund of Knowledge:Good  Language:Good   Psychomotor Activity  Psychomotor Activity:Psychomotor Activity: Normal   Assets  Assets:Communication Skills; Desire for Improvement; Financial Resources/Insurance; Physical Health; Resilience; Social Support   Sleep  Sleep:Sleep: Good Number of Hours of Sleep: 8   No data recorded  Physical Exam  Physical Exam Vitals and nursing note reviewed.  Constitutional:      General: She is not in acute distress.    Appearance: Normal appearance. She is not ill-appearing.  HENT:     Head: Normocephalic.  Eyes:     General:        Right eye: No discharge.        Left eye: No discharge.     Conjunctiva/sclera: Conjunctivae normal.  Cardiovascular:     Rate and Rhythm: Normal rate.  Pulmonary:     Effort: Pulmonary effort is normal.  Musculoskeletal:        General: Normal range of motion.      Cervical back: Normal range of motion.  Skin:    Coloration: Skin is not jaundiced or pale.  Neurological:     Mental Status: She is alert and oriented to person, place, and time.  Psychiatric:        Attention and Perception: Attention normal.        Mood and Affect: Mood and affect normal.        Speech: Speech normal.        Behavior: Behavior normal. Behavior is cooperative.        Thought Content: Thought content normal.        Cognition and Memory: Cognition normal.        Judgment: Judgment is impulsive.    Review of Systems  Constitutional: Negative.   HENT: Negative.    Eyes: Negative.   Respiratory: Negative.    Cardiovascular: Negative.   Musculoskeletal: Negative.   Skin: Negative.   Neurological: Negative.   Psychiatric/Behavioral:  Positive for hallucinations.    Blood pressure 101/73, pulse 84, temperature 98.3 F (36.8 C), temperature source Oral, resp. rate 16, SpO2 99 %. There is no height or weight on file to calculate BMI.  Treatment Plan Summary:  Disposition: Patient will be reevaluated by psychiatry in the a.m.  If patient continues to improve it is possible she could be discharged.  Referral was made to CD IOP.  Will continue to have daily contact with patient to assess and evaluate symptoms and progress in treatment and Medication management.   Restarted Lexapro 10 mg daily at patient's request.  Ardis Hughs, NP 07/09/2022 2:46 PM

## 2022-07-09 NOTE — ED Notes (Signed)
Patient calmly sleeping. Breathing even and unlabored.

## 2022-07-09 NOTE — ED Notes (Signed)
Gave Pt. Food around [place] Per day shift they said to try and get her to eat and drink. She ate some but not all of her meal.

## 2022-07-09 NOTE — ED Notes (Signed)
Patient denies SI,HI, and A/V/H with no plan/intent. Patient denies any pain/discomfort at this time. Calm and cooperative on unit.

## 2022-07-09 NOTE — ED Notes (Signed)
Patient ate dinner, med compliant, and appears in much better mood. Cooperative on unit. Patient currently laying in bed watching a movie. No current distress.

## 2022-07-10 ENCOUNTER — Ambulatory Visit (INDEPENDENT_AMBULATORY_CARE_PROVIDER_SITE_OTHER): Payer: Commercial Managed Care - PPO | Admitting: Licensed Clinical Social Worker

## 2022-07-10 ENCOUNTER — Encounter (HOSPITAL_COMMUNITY): Payer: Self-pay

## 2022-07-10 DIAGNOSIS — F1220 Cannabis dependence, uncomplicated: Secondary | ICD-10-CM

## 2022-07-10 DIAGNOSIS — F22 Delusional disorders: Secondary | ICD-10-CM | POA: Diagnosis not present

## 2022-07-10 DIAGNOSIS — F1020 Alcohol dependence, uncomplicated: Secondary | ICD-10-CM

## 2022-07-10 DIAGNOSIS — F1420 Cocaine dependence, uncomplicated: Secondary | ICD-10-CM

## 2022-07-10 MED ORDER — HYDROXYZINE HCL 25 MG PO TABS: 25.0000 mg | ORAL_TABLET | Freq: Three times a day (TID) | ORAL | 0 refills | Status: AC | PRN

## 2022-07-10 MED ORDER — OLANZAPINE 5 MG PO TBDP: 5.0000 mg | ORAL_TABLET | Freq: Every day | ORAL | 0 refills | Status: AC

## 2022-07-10 NOTE — Progress Notes (Signed)
Tammy Matthews presents for an assessment to start the SA IOP at this office accompanied by her mother who sits in during the assessment. The therapist answers Tammy Matthews and her mother's questions about the program.   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 She says that she first used Cocaine around age 31. She says that she will snort cocaine for four days at a time using as much as she can and then may go a month and not use. She last used on Thursday, 07/06/22, having snorted about an eight ball.   In addition, she has used THC daily for the past two years. She will use pot one day and wax the next saying that she will typically use her dab pen two to three times to go to sleep. She last used any THC around the middle of last week.   She first got drunk on alcohol at age 31. She quit drinking on December 9th of 2022 at which time she was drinking a 5th of vodka daily. She has experienced alcohol-related blackouts and says that when she quit drinking that she had the sweats, insomnia, and restless legs. She has been to one AA meeting in her life but has no other treatment history of substance use and has to be educated on what is meant by sobriety.   Tammy Matthews has an identical twin sister who used to drink heavily but who stopped after getting a DWI. Her paternal grandmother drank a lot of wine consuming a gallon jug every two days. Her maternal  great grandmother was addicted to pain medications and alcohol. Her mother discloses that she went to some AA meetings in her 59s but concluded that her issue was not alcohol but self-esteem problems.   Tammy Matthews has a psychiatrist, Dr. Merrily Matthews, who has been treating her for depression for 6 months. Tammy Matthews put Tammy Matthews on Lexapro after Prozac did not agree with her. She had a virtual therapist with [facility] who diagnosed her with ADHD about 6 months ago; however, Tammy Matthews did well in grammar school, had a 3.8 GPA in high school,  and had a 3.2 GPA in college with no behavioral issues. She says that she was a four sport athlete in high school and involved in several clubs.   Tammy Matthews says that she has definitely used more substances than intended and that she gave up sewing and exercise because of her substance use. She continued to use in spite of having know problems and has built tolerance to substances and experienced withdrawal.   She moved back from [place] to [place] where she grew up. She works at [employer] but also has worked at the [employer], where she has a lot of substance-using friends with no sober supports. Her boyfriend lives in [place] and is in recovery from addiction.   Tammy Matthews is going to call [employer] and confirm her benefits as this therapist and Tammy Matthews cannot get through to them today. She is also going to see about a reduced work schedule but does not believe she has been at her work long enough for [employer].   If her insurance coverage is acceptable and she can get her work schedule reduced, she wants to start SA IOP on 07/12/22. She will call this therapist back to confirm tomorrow having his direct callback number.  Tammy Matthews, Brice, LCSW, [facility], [place] 07/10/2022

## 2022-07-10 NOTE — Plan of Care (Signed)
Kaedyn verbally agrees to the Care Plan and the therapist electronically signing the Care Plan on her behalf:  Problem: Substance Use Goal:  Ellicia will abstain from drugs and alcohol 7/7 days per week as evidenced by negative weekly UDS and self-report. Outcome: Not Applicable Intervention: Therapist will assist Edyn in learning how to identify and change thoughts and behaviors that lead to using drugs and alcohol and how to avoid triggers. Note: Interventions discussed with Ulyssa today.  Intervention: Therapist will assist [place] in navigating any barriers or obstacles she encounters in getting established with NA or AA and obtaining a Sponsor and beginning to work steps.  Note: Interventions discussed with Keyairra today.

## 2022-07-10 NOTE — ED Notes (Signed)
Patient observed sleeping. Breathing even and unlabored.

## 2022-07-10 NOTE — ED Notes (Signed)
Remains asleep with no signs of distress or sleep disturbance noted skin color appears appropriate for her ethnicity and her respirations are easy.

## 2022-07-10 NOTE — Progress Notes (Signed)
Patient is discharging at this time. Patient is A&Ox4. Vs stable. Patient denies SI,HI, and A/V/H with no plan/intent. Printed AVS reviewed with and given to patient along with medications and follow up appointments. Patient verbalized all understanding. All valuables/belongings returned to patient. Patient is being transported by her mother. Patient denies any pain/discomfort. No s/s of current distress.  

## 2022-07-10 NOTE — Discharge Instructions (Addendum)
Please follow up with [facility] today

## 2022-07-10 NOTE — ED Provider Notes (Signed)
FBC/OBS ASAP Discharge Summary  Date and Time: 07/10/2022 1:30 PM  Name: Tammy Matthews  MRN:  [id number]   Discharge Diagnoses:  Final diagnoses:  Paranoid ideation [facility](HCC)    Subjective:  Tammy Matthews 31 y.o., female patient who [facility] 6/15//2023.  She was admitted to the continuous assessment unit while awaiting inpatient psychiatric bed availability.  While at the facility patient was placed under involuntary commitment.   Tammy Matthews, 31 y.o., female patient seen face to face by this provider, consulted with Dr. Viviano SimasMaurer; and chart reviewed on 07/10/22.  Per chart review she has been diagnosed with anxiety, MDD, and ADD.  She has services in place without virtual provider Dr. Marella ChimesArnstead.  She is prescribed methylphenidate, Lexapro, and doxepin.  She reports compliance with medications.  She denies any history of inpatient psychiatric admissions.  Upon admission UDS is positive for cocaine and marijuana. EtOH is negative.  On today's evaluation Tammy Matthews is laying in her bed awake.  She is alert/oriented, pleasant and bright on approach.  She has normal speech and behavior.  She maintains good eye contact and is able to converse coherently.  She is answering questions appropriately.  She does not appear manic or psychotic.  She continues to deny depression.  States she feels "so much better".  She expresses her appreciation for having this time on the unit.  States, "this is exactly what I needed, I needed rest and to reset".  She has a euthymic affect.  She is denying SI/HI/AVH.  She contracts for safety.  She denies any access to firearms/weapons.  She continues to be motivated to maintain her sobriety from cocaine and marijuana.  Referral was made to [facility].  Patient has an in person intake appointment at 2:30 PM today.  Patient was educated that this program is in person.  She was also educated that she would not be able to participate in the program if she  continued to take her methylphenidate.  Patient is in agreement that she will no longer take the methylphenidate states, "I rarely took it anyway".  If she does feel that she needs medication for ADHD she is instructed to have her provider switch her to [employer].  Patient verbalized understanding..  Collateral: Gillermo MurdochArlene Tuft patient's mother.  Patient's mother has no immediate safety concerns with patient being discharged.  She is focused on patient maintaining her sobriety from cocaine and marijuana.  She will pick patient up from this facility and transport her to her 230 intake appointment.  Patient and her mother have a plan in place.  Patient's mother plans to stay with patient for 1-2 weeks.   Stay Summary:   Tammy Matthews [facility] and was initially recommended for inpatient psychiatric admission.  However there was no bed availability and patient remained on the unit for over 3 days.  During that time she made significant improvement.  she was compliant with medications.  She was treated with Zyprexa 5 mg nightly and hydroxyzine 25 mg 3 times daily as needed which she tolerated with no adverse reactions.  She will be discharged with a printed prescription for 30-day supply.  She was appropriate with staff and other patients.  Patient no longer meets criteria for inpatient psychiatric admission, nor does she meet the criteria to uphold the involuntary commitment.  Patient will be discharged and she will follow-up with [facility] for her intake appointment today at 2:30 PM.  Upon completion of this admission the [name] was both mentally and medically stable for discharge denying suicidal/homicidal ideation, auditory/visual/tactile hallucinations, delusional thoughts and paranoia.     Total Time spent with patient: 45 minutes  Past Psychiatric History: See H&P Past Medical History: History reviewed. No pertinent past medical history. History  reviewed. No pertinent surgical history. Family History: History reviewed. No pertinent family history. Family Psychiatric History: See H&P Social History:  Social History   Substance and Sexual Activity  Alcohol Use None     Social History   Substance and Sexual Activity  Drug Use Not on file    Social History   Socioeconomic History   Marital status: Single    Spouse name: Not on file   Number of children: Not on file   Years of education: Not on file   Highest education level: Not on file  Occupational History   Not on file  Tobacco Use   Smoking status: Not on file   Smokeless tobacco: Not on file  Substance and Sexual Activity   Alcohol use: Not on file   Drug use: Not on file   Sexual activity: Not on file  Other Topics Concern   Not on file  Social History Narrative   Not on file   Social Determinants of Health   Financial Resource Strain: Not on file  Food Insecurity: Not on file  Transportation Needs: Not on file  Physical Activity: Not on file  Stress: Not on file  Social Connections: Not on file   SDOH:  SDOH Screenings   Depression (PHQ2-9): Low Risk  (07/07/2022)    Tobacco Cessation:  N/A, patient does not currently use tobacco products  Current Medications:  Current Facility-Administered Medications  Medication Dose Route Frequency Provider Last Rate Last Admin   acetaminophen (TYLENOL) tablet 650 mg  650 mg Oral Q6H PRN Lenard Lance, FNP       alum & mag hydroxide-simeth (MAALOX/MYLANTA) 200-200-20 MG/5ML suspension 30 mL  30 mL Oral Q4H PRN Lenard Lance, FNP       escitalopram (LEXAPRO) tablet 10 mg  10 mg Oral Daily Ardis Hughs, NP   10 mg at 07/10/22 1003   hydrOXYzine (ATARAX) tablet 25 mg  25 mg Oral TID PRN Lenard Lance, FNP   25 mg at 07/09/22 1611   OLANZapine zydis (ZYPREXA) disintegrating tablet 5 mg  5 mg Oral Q8H PRN Lenard Lance, FNP       And   LORazepam (ATIVAN) tablet 1 mg  1 mg Oral PRN Lenard Lance, FNP        And   ziprasidone (GEODON) injection 20 mg  20 mg Intramuscular PRN Lenard Lance, FNP       magnesium hydroxide (MILK OF MAGNESIA) suspension 30 mL  30 mL Oral Daily PRN Lenard Lance, FNP       OLANZapine zydis (ZYPREXA) disintegrating tablet 2.5 mg  2.5 mg Oral BID PRN Lenard Lance, FNP       OLANZapine zydis (ZYPREXA) disintegrating tablet 5 mg  5 mg Oral QHS Lenard Lance, FNP   5 mg at 07/09/22 2110   traZODone (DESYREL) tablet 50 mg  50 mg Oral QHS PRN Lenard Lance, FNP   50 mg at 07/09/22 2110   Current Outpatient Medications  Medication Sig Dispense Refill   doxepin (SINEQUAN) 50 MG capsule Take 50 mg by mouth at bedtime.     escitalopram (LEXAPRO) 10 MG  tablet Take 10 mg by mouth daily.     methylphenidate (RITALIN) 10 MG tablet Take 10 mg by mouth daily.     Multiple Vitamin (MULTIVITAMIN WITH MINERALS) TABS tablet Take 1 tablet by mouth daily.      PTA Medications: (Not in a hospital admission)      07/07/2022    3:31 PM  Depression screen PHQ 2/9  Decreased Interest 1  Down, Depressed, Hopeless 0  PHQ - 2 Score 1    Flowsheet Row ED [facility] 07/07/2022 in [facility]  C-SSRS RISK CATEGORY No Risk       Musculoskeletal  Strength & Muscle Tone: within normal limits Gait & Station: normal Patient leans: N/A  Psychiatric Specialty Exam  Presentation  General Appearance: Appropriate for Environment  Eye Contact:Good  Speech:Clear and Coherent  Speech Volume:Normal  Handedness:Right   Mood and Affect  Mood:Euthymic  Affect:Congruent   Thought Process  Thought Processes:Coherent  Descriptions of Associations:Intact  Orientation:Full (Time, Place and Person)  Thought Content:Logical  Diagnosis of Schizophrenia or Schizoaffective disorder in past: No  Duration of Psychotic Symptoms: Less than six months   Hallucinations:Hallucinations: Auditory Description of Auditory Hallucinations: music  Ideas of  Reference:None  Suicidal Thoughts:Suicidal Thoughts: No  Homicidal Thoughts:Homicidal Thoughts: No   Sensorium  Memory:Immediate Good; Recent Good; Remote Good  Judgment:Good  Insight:Good   Executive Functions  Concentration:Good  Attention Span:Good  Recall:Good  Fund of Knowledge:Good  Language:Good   Psychomotor Activity  Psychomotor Activity:Psychomotor Activity: Normal   Assets  Assets:Communication Skills; Desire for Improvement; Financial Resources/Insurance; Housing; Physical Health; Resilience; Social Support   Sleep  Sleep:Sleep: Good Number of Hours of Sleep: 8   No data recorded  Physical Exam  Physical Exam Vitals and nursing note reviewed.  Constitutional:      General: She is not in acute distress.    Appearance: Normal appearance. She is not ill-appearing.  HENT:     Head: Normocephalic.  Eyes:     Conjunctiva/sclera: Conjunctivae normal.  Cardiovascular:     Rate and Rhythm: Normal rate.  Pulmonary:     Effort: Pulmonary effort is normal.  Musculoskeletal:        General: Normal range of motion.     Cervical back: Normal range of motion.  Skin:    General: Skin is warm and dry.  Neurological:     Mental Status: She is alert and oriented to person, place, and time.  Psychiatric:        Attention and Perception: Attention and perception normal.        Mood and Affect: Mood and affect normal.        Speech: Speech normal.        Behavior: Behavior normal. Behavior is cooperative.        Thought Content: Thought content normal.        Cognition and Memory: Cognition normal.        Judgment: Judgment is impulsive.    Review of Systems  Constitutional: Negative.   HENT: Negative.    Eyes: Negative.   Respiratory: Negative.    Cardiovascular: Negative.   Musculoskeletal: Negative.   Skin: Negative.   Neurological: Negative.   Psychiatric/Behavioral: Negative.     Blood pressure 106/67, pulse 70, temperature 98.1 F (36.7  C), temperature source Oral, resp. rate 18, SpO2 100 %. There is no height or weight on file to calculate BMI.  Demographic Factors:  Caucasian  Loss Factors: NA  Historical Factors: Impulsivity  Risk Reduction Factors:   Sense of responsibility to family, Employed, Living with another person, especially a relative, Positive social support, Positive therapeutic relationship, and Positive coping skills or problem solving skills  Continued Clinical Symptoms:  Severe Anxiety and/or Agitation Depression:   Comorbid alcohol abuse/dependence Impulsivity Alcohol/Substance Abuse/Dependencies  Cognitive Features That Contribute To Risk:  None    Suicide Risk:  Minimal: No identifiable suicidal ideation.  Patients presenting with no risk factors but with morbid ruminations; may be classified as minimal risk based on the severity of the depressive symptoms  Plan Of Care/Follow-up recommendations:  Activity:  as tolerated Diet:  regular  Disposition:   Discharge patient.  Rescind IVC  Printed prescription for Zyprexa 5 mg nightly and hydroxyzine 25 mg 3 times daily as needed provided.  Referral made to [facility] program patient has an intake appointment today at 2:30 PM.  Additional outpatient psychiatric resources given for medication management and therapy.  No evidence of imminent risk to self or others at present.    Patient does not meet criteria for psychiatric inpatient admission. Discussed crisis plan, support from social network, calling 911, coming to the Emergency Department, and calling Suicide Hotline.   Revonda Humphrey, NP 07/10/2022, 1:30 PM

## 2022-07-11 ENCOUNTER — Telehealth (HOSPITAL_COMMUNITY): Payer: Self-pay | Admitting: Licensed Clinical Social Worker

## 2022-07-11 LAB — PROLACTIN: Prolactin: 107 ng/mL — ABNORMAL HIGH (ref 4.8–23.3)

## 2022-07-11 NOTE — Telephone Encounter (Signed)
The therapist returns [employer] leaving a HIPAA-compliant voicemail.  She indicated in her message that she would not be attending the [facility] as she is going to [place] to stay with her mother for a while. She inquires about IOPs in the [place] area and whether this therapist could see her for therapy virtually.  Adam Phenix, [place], LCSW, [facility], [place] 07/11/2022
